# Patient Record
Sex: Female | Born: 1940 | Race: White | Hispanic: No | Marital: Married | Smoking: Never smoker
Health system: Southern US, Community
[De-identification: ages and names within clinical notes are randomized; demographics above are authoritative.]

## PROBLEM LIST (undated history)

## (undated) DIAGNOSIS — N8110 Cystocele, unspecified: Secondary | ICD-10-CM

## (undated) DIAGNOSIS — M8580 Other specified disorders of bone density and structure, unspecified site: Secondary | ICD-10-CM

---

## 2015-02-27 ENCOUNTER — Encounter: Payer: Self-pay | Admitting: *Deleted

## 2015-02-28 ENCOUNTER — Ambulatory Visit: Payer: Medicare Other | Admitting: Certified Registered Nurse Anesthetist

## 2015-02-28 ENCOUNTER — Encounter
Admission: RE | Disposition: A | Discharge status: Home / Self Care | Payer: Self-pay | Source: Ambulatory Visit | Attending: Unknown Physician Specialty

## 2015-02-28 ENCOUNTER — Encounter: Payer: Self-pay | Admitting: *Deleted

## 2015-02-28 ENCOUNTER — Ambulatory Visit
Admission: RE | Admit: 2015-02-28 | Discharge: 2015-02-28 | Disposition: A | Discharge status: Home / Self Care | Payer: Medicare Other | Source: Ambulatory Visit | Attending: Unknown Physician Specialty | Admitting: Unknown Physician Specialty

## 2015-02-28 DIAGNOSIS — Z1211 Encounter for screening for malignant neoplasm of colon: Secondary | ICD-10-CM | POA: Diagnosis present

## 2015-02-28 DIAGNOSIS — K621 Rectal polyp: Secondary | ICD-10-CM | POA: Insufficient documentation

## 2015-02-28 DIAGNOSIS — K640 First degree hemorrhoids: Secondary | ICD-10-CM | POA: Diagnosis not present

## 2015-02-28 HISTORY — PX: COLONOSCOPY WITH PROPOFOL: SHX5780

## 2015-02-28 HISTORY — DX: Other specified disorders of bone density and structure, unspecified site: M85.80

## 2015-02-28 HISTORY — DX: Cystocele, unspecified: N81.10

## 2015-02-28 SURGERY — COLONOSCOPY WITH PROPOFOL
Anesthesia: General

## 2015-02-28 MED ORDER — LIDOCAINE HCL (CARDIAC) 20 MG/ML IV SOLN
INTRAVENOUS | Status: DC | PRN
  Administered 2015-02-28: 60 mg via INTRAVENOUS

## 2015-02-28 MED ORDER — PROPOFOL 10 MG/ML IV BOLUS
INTRAVENOUS | Status: DC | PRN
  Administered 2015-02-28 (×3): 20 mg via INTRAVENOUS

## 2015-02-28 MED ORDER — MIDAZOLAM HCL 2 MG/2ML IJ SOLN
INTRAMUSCULAR | Status: DC | PRN
  Administered 2015-02-28: 1 mg via INTRAVENOUS

## 2015-02-28 MED ORDER — SODIUM CHLORIDE 0.9 % IV SOLN
INTRAVENOUS | Status: DC
Start: 2015-02-28 — End: 2015-02-28

## 2015-02-28 MED ORDER — PROPOFOL 500 MG/50ML IV EMUL
INTRAVENOUS | Status: DC | PRN
  Administered 2015-02-28: 120 ug/kg/min via INTRAVENOUS

## 2015-02-28 MED ORDER — SODIUM CHLORIDE 0.9 % IV SOLN
INTRAVENOUS | Status: DC
Start: 2015-02-28 — End: 2015-02-28
  Administered 2015-02-28: 1000 mL via INTRAVENOUS

## 2015-02-28 NOTE — Transfer of Care (Signed)
Immediate Anesthesia Transfer of Care Note  Patient: Carla Mccarthy  Procedure(s) Performed: Procedure(s): COLONOSCOPY WITH PROPOFOL (N/A)  Patient Location: PACU  Anesthesia Type:General  Level of Consciousness: sedated  Airway & Oxygen Therapy: Patient Spontanous Breathing and Patient connected to nasal cannula oxygen  Post-op Assessment: Report given to RN and Post -op Vital signs reviewed and stable  Post vital signs: Reviewed and stable  Last Vitals:  Filed Vitals:   02/28/15 1319 02/28/15 1404  BP: 128/83 109/57  Pulse: 90 94  Temp: 36.5 C 36 C  Resp: 18 20    Complications: No apparent anesthesia complications

## 2015-02-28 NOTE — Anesthesia Procedure Notes (Signed)
Date/Time: 02/28/2015 1:42 PM Performed by: Ginger Carne Pre-anesthesia Checklist: Patient identified, Emergency Drugs available, Suction available, Patient being monitored and Timeout performed Patient Re-evaluated:Patient Re-evaluated prior to inductionOxygen Delivery Method: Nasal cannula

## 2015-02-28 NOTE — H&P (Signed)
   Primary Care Physician:  Danella Penton., MD Primary Gastroenterologist:  Dr. Mechele Collin  Pre-Procedure History & Physical: HPI:  Carla Mccarthy is a 75 y.o. female is here for an colonoscopy.   Past Medical History  Diagnosis Date  . Osteopenia   . Vaginal prolapse     History reviewed. No pertinent past surgical history.  Prior to Admission medications   Not on File    Allergies as of 01/29/2015  . (Not on File)    History reviewed. No pertinent family history.  Social History   Social History  . Marital Status: Married    Spouse Name: N/A  . Number of Children: N/A  . Years of Education: N/A   Occupational History  . Not on file.   Social History Main Topics  . Smoking status: Never Smoker   . Smokeless tobacco: Never Used  . Alcohol Use: No  . Drug Use: No  . Sexual Activity: Not on file   Other Topics Concern  . Not on file   Social History Narrative    Review of Systems: See HPI, otherwise negative ROS  Physical Exam: BP 128/83 mmHg  Pulse 90  Temp(Src) 97.7 F (36.5 C) (Tympanic)  Resp 18  Ht  (1.6 m)  Wt 54.885 kg (121 lb)  BMI 21.44 kg/m2  SpO2 100% General:   Alert,  pleasant and cooperative in NAD Head:  Normocephalic and atraumatic. Neck:  Supple; no masses or thyromegaly. Lungs:  Clear throughout to auscultation.    Heart:  Regular rate and rhythm. Abdomen:  Soft, nontender and nondistended. Normal bowel sounds, without guarding, and without rebound.   Neurologic:  Alert and  oriented x4;  grossly normal neurologically.  Impression/Plan: [name] is here for an colonoscopy to be performed for [facility] colon polyps  Risks, benefits, limitations, and alternatives regarding  colonoscopy have been reviewed with the patient.  Questions have been answered.  All parties agreeable.   Lynnae Prude, MD  02/28/2015, 1:38 PM

## 2015-02-28 NOTE — Anesthesia Postprocedure Evaluation (Signed)
Anesthesia Post Note  Patient: Carla Mccarthy  Procedure(s) Performed: Procedure(s) (LRB): COLONOSCOPY WITH PROPOFOL (N/A)  Patient location during evaluation: Endoscopy Anesthesia Type: General Level of consciousness: awake and alert Pain management: pain level controlled Vital Signs Assessment: post-procedure vital signs reviewed and stable Respiratory status: spontaneous breathing and respiratory function stable Cardiovascular status: stable Anesthetic complications: no    Last Vitals:  Filed Vitals:   02/28/15 1424 02/28/15 1434  BP: 113/67 116/59  Pulse: 77 73  Temp:    Resp: 16 18    Last Pain: There were no vitals filed for this visit.               KEPHART,WILLIAM K

## 2015-02-28 NOTE — Op Note (Signed)
[facility] Patient Name: Carla Mccarthy Procedure Date: 02/28/2015 1:39 PM MRN: [id number] Account #: [id number] Date of Birth: 12/21/1940 Admit Type: Outpatient Age: 75 Room: [facility] ENDO ROOM 1 Gender: Female Note Status: Finalized Procedure:         Colonoscopy Indications:       High risk colon cancer surveillance: Personal history of                     colonic polyps Providers:         Scot Jun, MD Referring MD:      Danella Penton, MD (Referring MD) Medicines:         Propofol per Anesthesia Complications:     No immediate complications. Procedure:         Pre-Anesthesia Assessment:                    - After reviewing the risks and benefits, the patient was                     deemed in satisfactory condition to undergo the procedure.                    After obtaining informed consent, the colonoscope was                     passed under direct vision. Throughout the procedure, the                     patient's blood pressure, pulse, and oxygen saturations                     were monitored continuously. The Colonoscope was                     introduced through the anus and advanced to the the cecum,                     identified by appendiceal orifice and ileocecal valve. The                     colonoscopy was performed without difficulty. The patient                     tolerated the procedure well. The quality of the bowel                     preparation was excellent. Findings:      A diminutive polyp was found in the rectum. The polyp was sessile. The       polyp was removed with a cold biopsy forceps. Resection and retrieval       were complete.      Internal hemorrhoids were found during endoscopy. The hemorrhoids were       small and Grade I (internal hemorrhoids that do not prolapse).      The exam was otherwise without abnormality. Impression:        - One diminutive polyp in the rectum. Resected and       retrieved.                    - Internal hemorrhoids.                    - The examination was otherwise normal. Recommendation:    -  Await pathology results. Scot Jun, MD 02/28/2015 2:02:54 PM This report has been signed electronically. Number of Addenda: 0 Note Initiated On: 02/28/2015 1:39 PM Scope Withdrawal Time: 0 hours 9 minutes 16 seconds  Total Procedure Duration: 0 hours 16 minutes 50 seconds       [facility]

## 2015-02-28 NOTE — Anesthesia Preprocedure Evaluation (Signed)
Anesthesia Evaluation  Patient identified by MRN, date of birth, ID band Patient awake    Reviewed: Allergy & Precautions, NPO status , Patient's Chart, lab work & pertinent test results  History of Anesthesia Complications Negative for: history of anesthetic complications  Airway Mallampati: III       Dental   Pulmonary neg pulmonary ROS,           Cardiovascular negative cardio ROS       Neuro/Psych negative neurological ROS     GI/Hepatic negative GI ROS, Neg liver ROS,   Endo/Other  negative endocrine ROS  Renal/GU negative Renal ROS     Musculoskeletal   Abdominal   Peds  Hematology negative hematology ROS (+)   Anesthesia Other Findings   Reproductive/Obstetrics                             Anesthesia Physical Anesthesia Plan  ASA: I  Anesthesia Plan: General   Post-op Pain Management:    Induction: Intravenous  Airway Management Planned:   Additional Equipment:   Intra-op Plan:   Post-operative Plan:   Informed Consent: I have reviewed the patients History and Physical, chart, labs and discussed the procedure including the risks, benefits and alternatives for the proposed anesthesia with the patient or authorized representative who has indicated his/her understanding and acceptance.     Plan Discussed with:   Anesthesia Plan Comments:         Anesthesia Quick Evaluation

## 2015-03-01 LAB — SURGICAL PATHOLOGY

## 2015-03-04 ENCOUNTER — Encounter: Payer: Self-pay | Admitting: Unknown Physician Specialty

## 2016-12-14 ENCOUNTER — Other Ambulatory Visit: Payer: Self-pay | Admitting: Internal Medicine

## 2016-12-14 DIAGNOSIS — Z1231 Encounter for screening mammogram for malignant neoplasm of breast: Secondary | ICD-10-CM

## 2016-12-17 ENCOUNTER — Other Ambulatory Visit: Payer: Self-pay | Admitting: *Deleted

## 2016-12-17 ENCOUNTER — Inpatient Hospital Stay
Admission: RE | Admit: 2016-12-17 | Discharge: 2016-12-17 | Disposition: A | Discharge status: Home / Self Care | Payer: Self-pay | Source: Ambulatory Visit | Attending: *Deleted | Admitting: *Deleted

## 2016-12-17 ENCOUNTER — Ambulatory Visit
Admission: RE | Admit: 2016-12-17 | Discharge: 2016-12-17 | Disposition: A | Discharge status: Home / Self Care | Payer: Medicare Other | Source: Ambulatory Visit | Attending: Internal Medicine | Admitting: Internal Medicine

## 2016-12-17 DIAGNOSIS — Z9289 Personal history of other medical treatment: Secondary | ICD-10-CM

## 2016-12-17 DIAGNOSIS — Z1231 Encounter for screening mammogram for malignant neoplasm of breast: Secondary | ICD-10-CM

## 2016-12-28 ENCOUNTER — Other Ambulatory Visit: Payer: Self-pay | Admitting: Internal Medicine

## 2016-12-28 DIAGNOSIS — I6523 Occlusion and stenosis of bilateral carotid arteries: Secondary | ICD-10-CM

## 2017-01-05 ENCOUNTER — Ambulatory Visit
Admission: RE | Admit: 2017-01-05 | Discharge: 2017-01-05 | Disposition: A | Discharge status: Home / Self Care | Payer: Medicare Other | Source: Ambulatory Visit | Attending: Internal Medicine | Admitting: Internal Medicine

## 2017-01-05 DIAGNOSIS — I6523 Occlusion and stenosis of bilateral carotid arteries: Secondary | ICD-10-CM | POA: Insufficient documentation

## 2020-04-08 ENCOUNTER — Other Ambulatory Visit: Payer: Self-pay | Admitting: Internal Medicine

## 2020-04-08 ENCOUNTER — Other Ambulatory Visit (HOSPITAL_COMMUNITY): Payer: Self-pay | Admitting: Internal Medicine

## 2020-04-08 DIAGNOSIS — M6281 Muscle weakness (generalized): Secondary | ICD-10-CM

## 2020-04-08 DIAGNOSIS — M5010 Cervical disc disorder with radiculopathy, unspecified cervical region: Secondary | ICD-10-CM

## 2020-04-08 DIAGNOSIS — R29898 Other symptoms and signs involving the musculoskeletal system: Secondary | ICD-10-CM

## 2020-04-16 ENCOUNTER — Other Ambulatory Visit: Payer: Self-pay

## 2020-04-16 ENCOUNTER — Ambulatory Visit
Admission: RE | Admit: 2020-04-16 | Discharge: 2020-04-16 | Disposition: A | Discharge status: Home / Self Care | Payer: Medicare Other | Source: Ambulatory Visit | Attending: Internal Medicine | Admitting: Internal Medicine

## 2020-04-16 DIAGNOSIS — M5010 Cervical disc disorder with radiculopathy, unspecified cervical region: Secondary | ICD-10-CM | POA: Insufficient documentation

## 2020-04-16 DIAGNOSIS — M6281 Muscle weakness (generalized): Secondary | ICD-10-CM | POA: Diagnosis present

## 2020-04-16 DIAGNOSIS — R29898 Other symptoms and signs involving the musculoskeletal system: Secondary | ICD-10-CM | POA: Insufficient documentation

## 2020-04-20 ENCOUNTER — Ambulatory Visit: Payer: Medicare Other

## 2020-08-07 ENCOUNTER — Other Ambulatory Visit: Payer: Self-pay | Admitting: Physical Medicine & Rehabilitation

## 2020-08-07 DIAGNOSIS — M5412 Radiculopathy, cervical region: Secondary | ICD-10-CM

## 2020-08-19 ENCOUNTER — Ambulatory Visit
Admission: RE | Admit: 2020-08-19 | Discharge: 2020-08-19 | Disposition: A | Discharge status: Home / Self Care | Payer: Medicare Other | Source: Ambulatory Visit | Attending: Physical Medicine & Rehabilitation | Admitting: Physical Medicine & Rehabilitation

## 2020-08-19 ENCOUNTER — Other Ambulatory Visit: Payer: Self-pay

## 2020-08-19 DIAGNOSIS — M5412 Radiculopathy, cervical region: Secondary | ICD-10-CM

## 2020-08-19 MED ORDER — IOPAMIDOL (ISOVUE-M 300) INJECTION 61%
1.0000 mL | Freq: Once | INTRAMUSCULAR | Status: AC
  Administered 2020-08-19: 1 mL via EPIDURAL

## 2020-08-19 MED ORDER — TRIAMCINOLONE ACETONIDE 40 MG/ML IJ SUSP (RADIOLOGY)
60.0000 mg | Freq: Once | INTRAMUSCULAR | Status: AC
  Administered 2020-08-19: 60 mg via EPIDURAL

## 2020-08-19 NOTE — Discharge Instructions (Signed)

## 2021-12-11 IMAGING — XA DG INJECT/[PERSON_NAME] INC NEEDLE/CATH/PLC EPI/CERV/THOR W/IMG
2 series · 2 of 2 positions shown · non-contrast
Comparison: none

CLINICAL DATA: Cervical radiculopathy. Paresthesias in the third
fourth and fifth digits of the left hand.

[Series 1: ortho standard · 1 of 1 slices shown (1 of 2)]
[im 1/1]
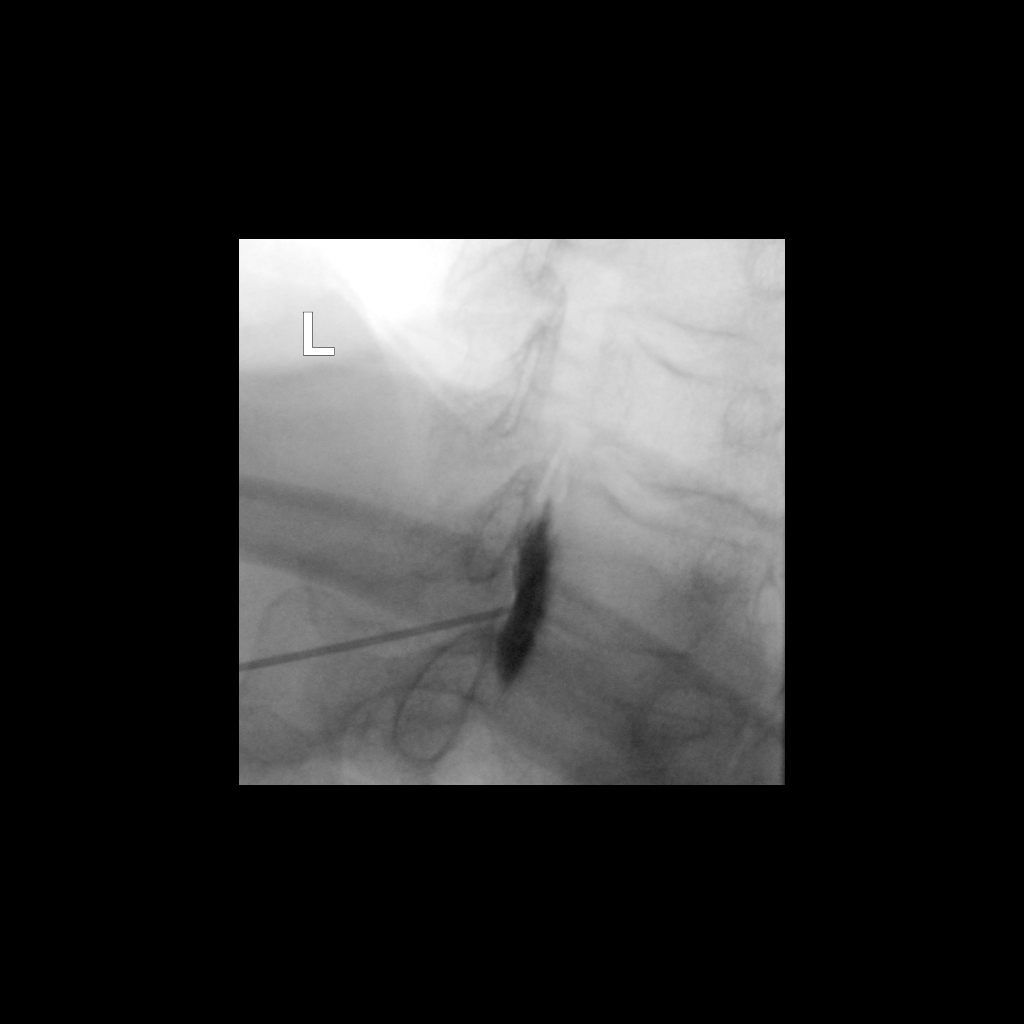

[Series 2: ortho standard · 1 of 1 slices shown (2 of 2)]
[im 1/1]
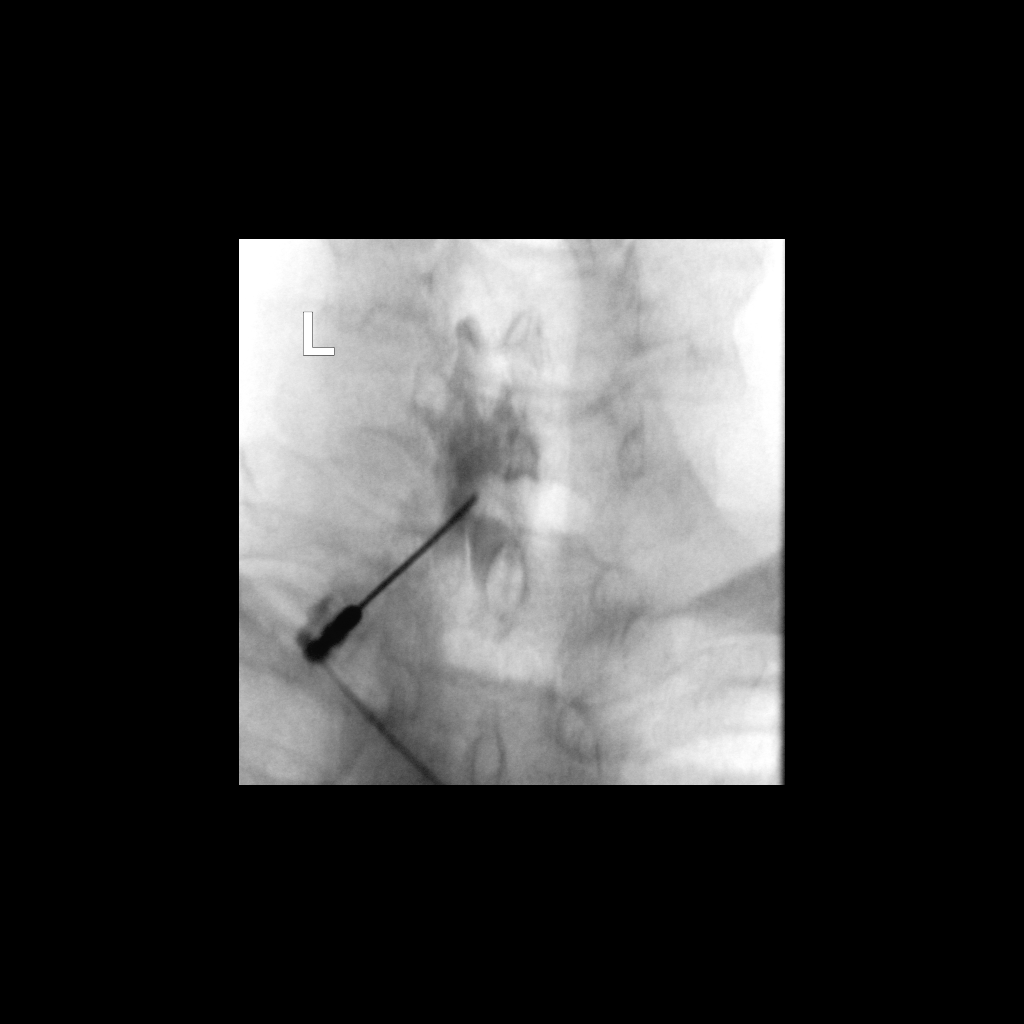

[2 of 2 positions shown; findings below may reference images not displayed]

FLUOROSCOPY TIME:  Radiation Exposure Index (as provided by the
fluoroscopic device): 13.51 uGy*m2

PROCEDURE:
CERVICAL EPIDURAL INJECTION

An interlaminar approach was performed on the left at C7-T1. A 20
gauge epidural needle was advanced using loss-of-resistance
technique.

DIAGNOSTIC EPIDURAL INJECTION

Injection of Isovue-M 300 shows a good epidural pattern with spread
above and below the level of needle placement, primarily on the
left. No vascular opacification is seen. THERAPEUTIC

EPIDURAL INJECTION

1.5 ml of Kenalog 40 mixed with 1 ml of 1% Lidocaine and 2 ml of
normal saline were then instilled. The procedure was well-tolerated,
and the patient was discharged thirty minutes following the
injection in good condition.
IMPRESSION: Technically successful first epidural injection on the left at
C7-T1.

## 2023-11-23 ENCOUNTER — Ambulatory Visit
Admission: RE | Admit: 2023-11-23 | Discharge: 2023-11-23 | Disposition: A | Discharge status: Home / Self Care | Source: Ambulatory Visit | Attending: Internal Medicine | Admitting: Internal Medicine

## 2023-11-23 ENCOUNTER — Other Ambulatory Visit: Payer: Self-pay | Admitting: Internal Medicine

## 2023-11-23 DIAGNOSIS — R100 Acute abdomen: Secondary | ICD-10-CM

## 2023-11-23 DIAGNOSIS — R1011 Right upper quadrant pain: Secondary | ICD-10-CM | POA: Diagnosis present
